# Patient Record
Sex: Male | Born: 1995 | Race: White | Hispanic: No | Marital: Single | Smoking: Never smoker
Health system: Southern US, Community
[De-identification: ages and names within clinical notes are randomized; demographics above are authoritative.]

## PROBLEM LIST (undated history)

## (undated) DIAGNOSIS — J45909 Unspecified asthma, uncomplicated: Secondary | ICD-10-CM

## (undated) HISTORY — PX: TONSILLECTOMY: SUR1361

---
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
## 2017-06-18 IMAGING — CT CT ABD-PELV W/ CM
2 of 4 series · 16 of 46 positions shown, 18 images · IV contrast (APPLIED)
Comparison: None.

CLINICAL DATA: Pt with RLQ abdominal pain, rectal bleeding, pain
started [REDACTED] but has gotten progressively worse per pt.Nausea,
denies vomiting or diarrhea, denies constipation, denies fever

EXAM:
CT ABDOMEN AND PELVIS WITH CONTRAST
TECHNIQUE: Multidetector CT imaging of the abdomen and pelvis was performed
using the standard protocol following bolus administration of
intravenous contrast.
CONTRAST:  100mL [id number] IOPAMIDOL ([id number]) INJECTION 61%

[Series 2: axial st · axial · 0.74mm/px · z∈[-476,-36]mm · 13 of 98 slices shown, 15 images]
[im 5/98  soft-tissue]
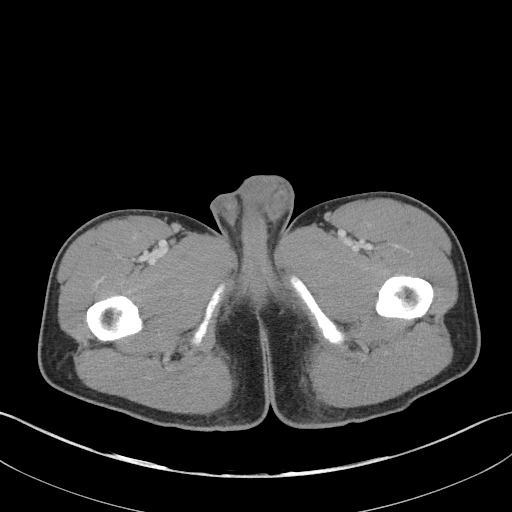
[im 5/98  bone]
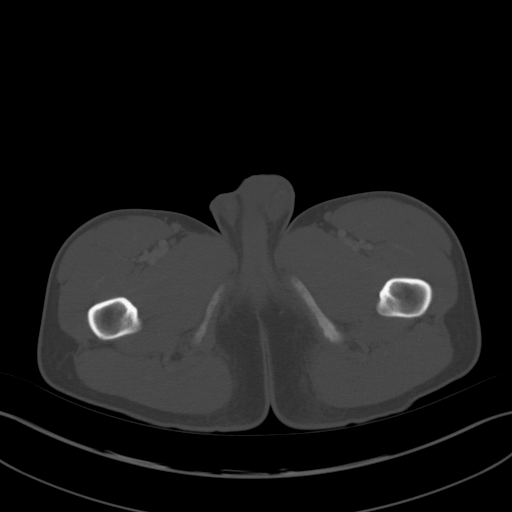
[im 13/98  soft-tissue]
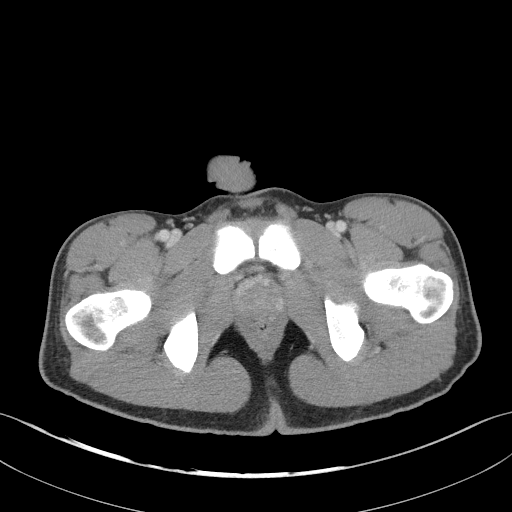
[im 21/98  soft-tissue]
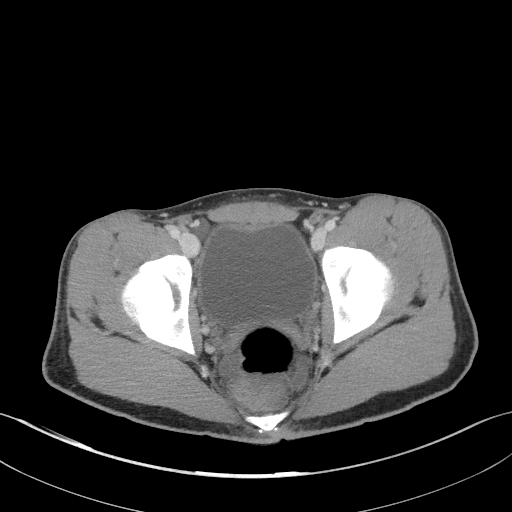
[im 29/98  soft-tissue]
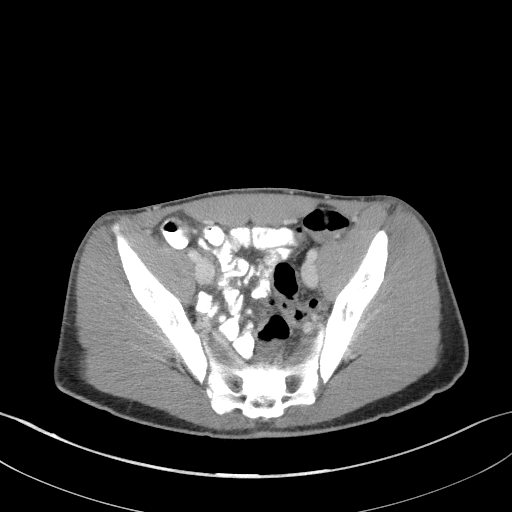
[im 33/98  soft-tissue]
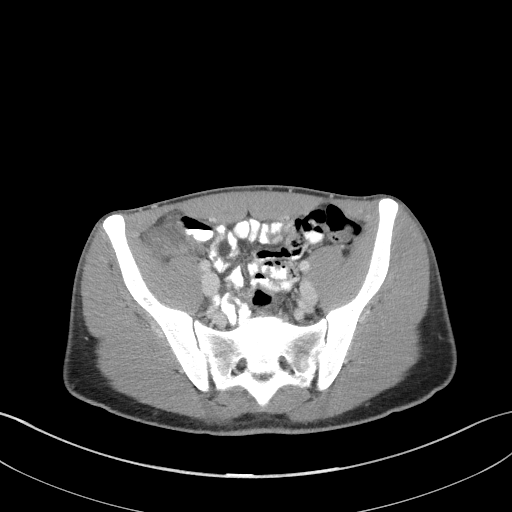
[im 41/98  soft-tissue]
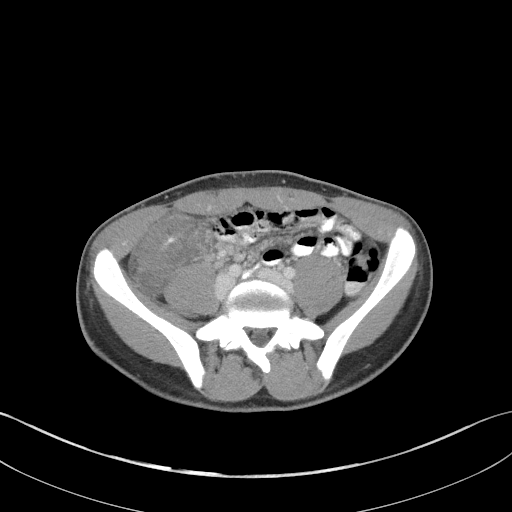
[im 49/98  soft-tissue]
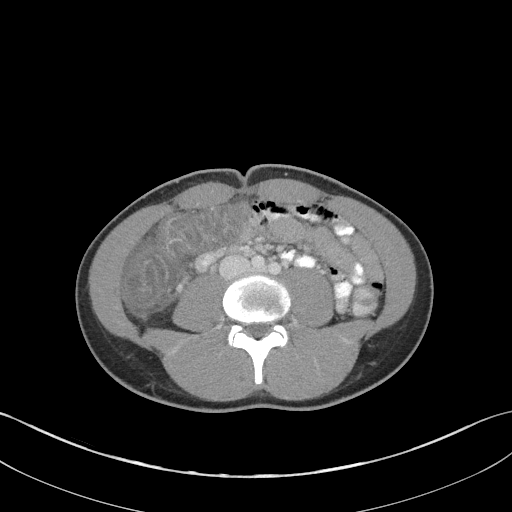
[im 57/98  soft-tissue]
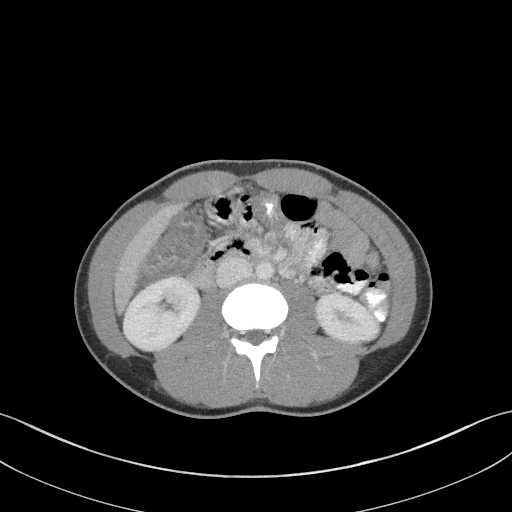
[im 65/98  soft-tissue]
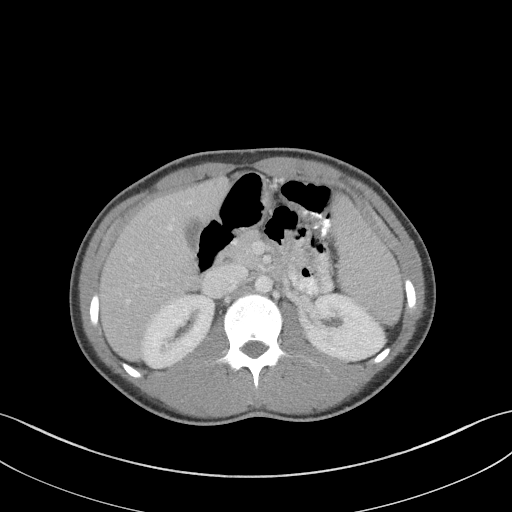
[im 65/98  bone]
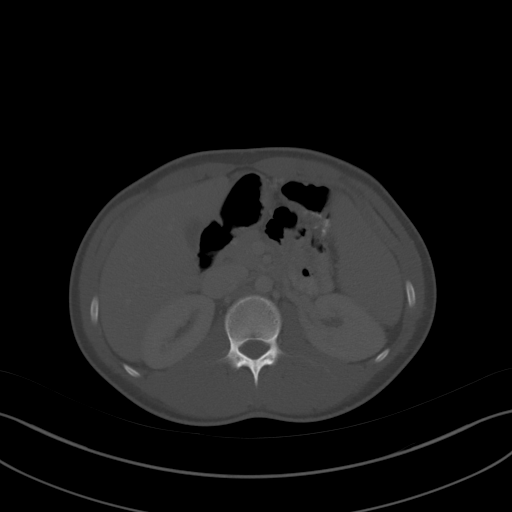
[im 69/98  soft-tissue]
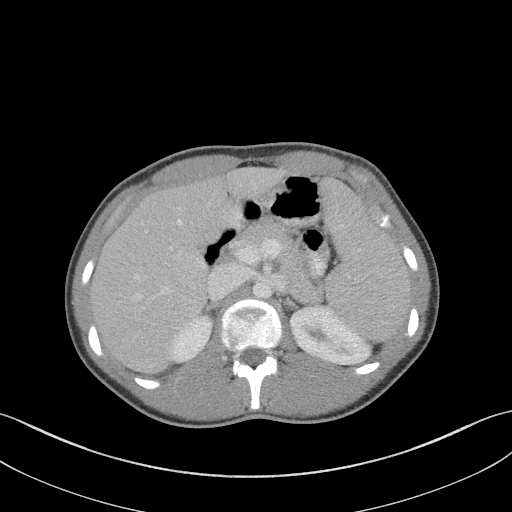
[im 77/98  soft-tissue]
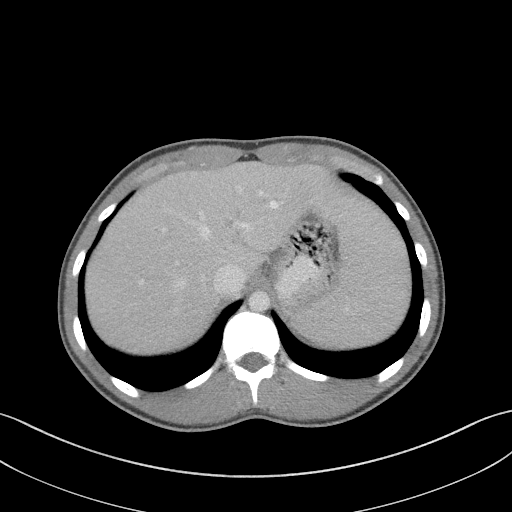
[im 85/98  soft-tissue]
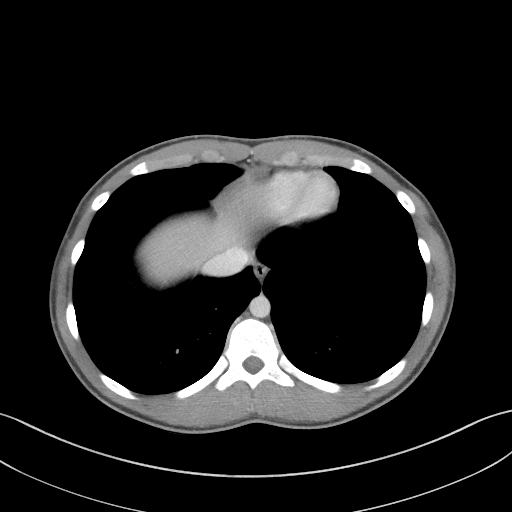
[im 93/98  soft-tissue]
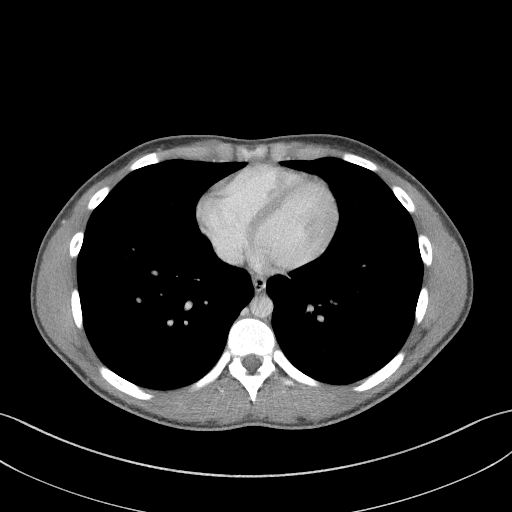

[Series 5: coronal st · coronal · 0.75mm/px · 3 of 65 slices shown]
[im 22/65  soft-tissue]
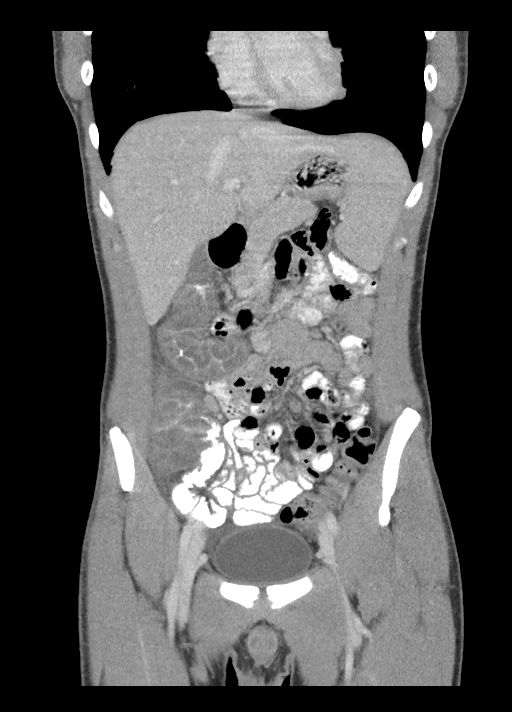
[im 29/65  soft-tissue]
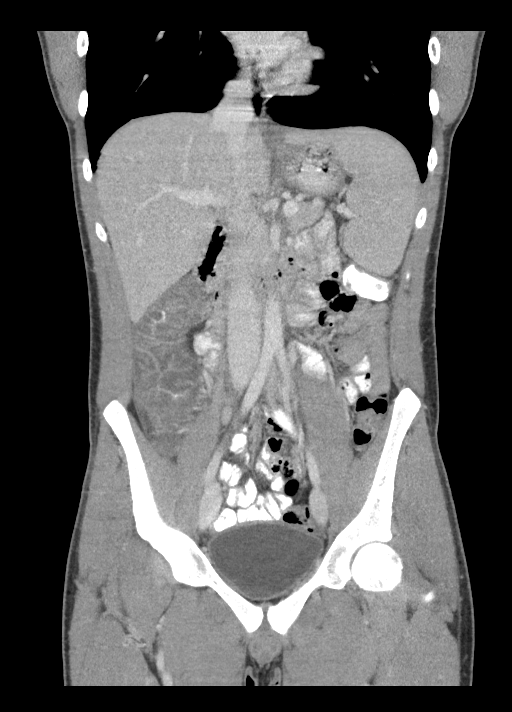
[im 36/65  soft-tissue]
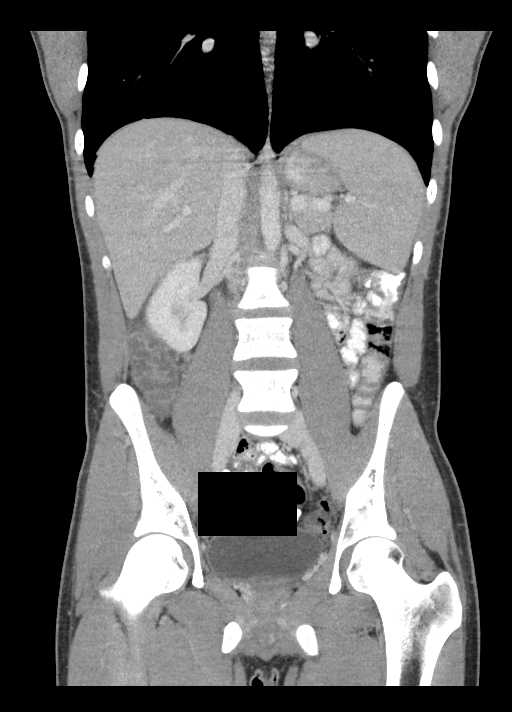

[16 of 46 positions shown; findings below may reference images not displayed]

FINDINGS: Lower chest: No acute abnormality.

Hepatobiliary: No focal liver abnormality is seen. No gallstones,
gallbladder wall thickening, or biliary dilatation.

Pancreas: Unremarkable. No pancreatic ductal dilatation or
surrounding inflammatory changes.

Spleen: Normal in size without focal abnormality.

Adrenals/Urinary Tract: Adrenal glands are unremarkable. Kidneys are
normal, without renal calculi, focal lesion, or hydronephrosis.
Bladder is unremarkable.

Stomach/Bowel: There is significant wall thickening of the cecum,
ascending colon and the right transverse colon. Normal wall
thickness of the left transverse colon and descending and sigmoid
colon. Rectum is unremarkable. Appendix is not visualized. Normal
stomach and small bowel.

Vascular/Lymphatic: No significant vascular findings are present. No
enlarged abdominal or pelvic lymph nodes.

Reproductive: Prostate is unremarkable.

Other: Trace pelvic free fluid.  No abdominal hernia.

Musculoskeletal: Unremarkable
IMPRESSION: 1. Significant wall thickening of the right colon consistent with an
inflammatory or infectious colitis.
2. Appendix not visualize, but no CT findings of acute appendicitis.
3. No other abnormalities.

## 2019-05-28 ENCOUNTER — Ambulatory Visit: Payer: Self-pay | Attending: Internal Medicine

## 2019-05-28 DIAGNOSIS — Z23 Encounter for immunization: Secondary | ICD-10-CM | POA: Insufficient documentation

## 2019-05-28 NOTE — Progress Notes (Signed)
   Covid-19 Vaccination Clinic  Name:  CAISEN MANGAS    MRN: [id number] DOB: Jun 02, 1995  05/28/2019  Mr. Gamero was observed post Covid-19 immunization for 15 minutes without incident. He was provided with Vaccine Information Sheet and instruction to access the V-Safe system.   Mr. Geiman was instructed to call 911 with any severe reactions post vaccine: Marland Kitchen Difficulty breathing  . Swelling of face and throat  . A fast heartbeat  . A bad rash all over body  . Dizziness and weakness   Immunizations Administered    Name Date Dose VIS Date Route   Pfizer COVID-19 Vaccine 05/28/2019 10:58 AM 0.3 mL 03/04/2019 Intramuscular   Manufacturer: [employer], [employer]   Lot: [id number]   NDC: [address]-3

## 2019-06-18 ENCOUNTER — Ambulatory Visit: Payer: Self-pay | Attending: Internal Medicine

## 2019-06-18 DIAGNOSIS — Z23 Encounter for immunization: Secondary | ICD-10-CM

## 2019-06-18 NOTE — Progress Notes (Signed)
   Covid-19 Vaccination Clinic  Name:  David Rasmussen    MRN: [id number] DOB: 07-27-1995  06/18/2019  [name] was observed post Covid-19 immunization for 15 minutes without incident. He was provided with Vaccine Information Sheet and instruction to access the V-Safe system.   [name] was instructed to call 911 with any severe reactions post vaccine: Marland Kitchen Difficulty breathing  . Swelling of face and throat  . A fast heartbeat  . A bad rash all over body  . Dizziness and weakness   Immunizations Administered    Name Date Dose VIS Date Route   Pfizer COVID-19 Vaccine 06/18/2019 12:20 PM 0.3 mL 03/04/2019 Intramuscular   Manufacturer: [employer], [employer]   Lot: [id number]   NDC: [address]-5

## 2019-11-28 ENCOUNTER — Ambulatory Visit (HOSPITAL_COMMUNITY)
Admission: EM | Admit: 2019-11-28 | Discharge: 2019-11-28 | Disposition: A | Discharge status: Home / Self Care | Payer: Managed Care, Other (non HMO) | Attending: Physician Assistant | Admitting: Physician Assistant

## 2019-11-28 ENCOUNTER — Encounter (HOSPITAL_COMMUNITY): Payer: Self-pay | Admitting: Emergency Medicine

## 2019-11-28 ENCOUNTER — Other Ambulatory Visit: Payer: Self-pay

## 2019-11-28 DIAGNOSIS — F419 Anxiety disorder, unspecified: Secondary | ICD-10-CM | POA: Diagnosis not present

## 2019-11-28 MED ORDER — HYDROXYZINE HCL 25 MG PO TABS: 12.5000 mg | ORAL_TABLET | Freq: Three times a day (TID) | ORAL | 0 refills | Status: AC | PRN

## 2019-11-28 NOTE — ED Notes (Signed)
EKG given to Despina Arias, NP and reviewed. Pt placed in Exam room for Eval.

## 2019-11-28 NOTE — ED Triage Notes (Signed)
Pt presents with anxiety, increased heart rate, nausea. States this happened for the first time last night.

## 2019-11-28 NOTE — ED Provider Notes (Signed)
  [facility]   MRN: [id number] DOB: Mar 17, 1996  Subjective:   David Rasmussen is a 24 y.o. male presenting for having an episode of anxiety last night, had an elevated heart rate, nausea and felt stressed out.  Patient states he has not had this happen before.  Denies fever, chest pain, shortness of breath, vomiting.  Patient has a good support network with his friends and at school.  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  States that his instructor is really hard on them to do well for the upcoming competition.  Denies drug use, alcohol use.  Patient is not a smoker.  Has never had to go to therapy.  Denies SI, HI.  No current facility-administered medications for this encounter. No current outpatient medications on file.   No Known Allergies  Past Medical History:  Diagnosis Date  . Asthma      Past Surgical History:  Procedure Laterality Date  . TONSILLECTOMY      History reviewed. No pertinent family history.  Social History   Tobacco Use  . Smoking status: Never Smoker  . Smokeless tobacco: Never Used  Substance Use Topics  . Alcohol use: No  . Drug use: No    ROS   Objective:   Vitals: BP 129/80 (BP Location: Right Arm)   Pulse 86   Temp 98.4 F (36.9 C) (Oral)   Resp 18   SpO2 100%   Physical Exam Constitutional:      General: He is not in acute distress.    Appearance: Normal appearance. He is well-developed and normal weight. He is not ill-appearing, toxic-appearing or diaphoretic.  HENT:     Head: Normocephalic and atraumatic.     Right Ear: External ear normal.     Left Ear: External ear normal.     Nose: Nose normal.     Mouth/Throat:     Pharynx: Oropharynx is clear.  Eyes:     General: No scleral icterus.       Right eye: No discharge.        Left eye: No discharge.     Extraocular Movements: Extraocular movements intact.     Pupils:  Pupils are equal, round, and reactive to light.  Cardiovascular:     Rate and Rhythm: Normal rate.  Pulmonary:     Effort: Pulmonary effort is normal.  Musculoskeletal:     Cervical back: Normal range of motion.  Neurological:     Mental Status: He is alert and oriented to person, place, and time.  Psychiatric:        Mood and Affect: Mood normal.        Behavior: Behavior normal.        Thought Content: Thought content normal.        Judgment: Judgment normal.     Assessment and Plan :   PDMP not reviewed this encounter.  1. Anxiety     Start hydroxyzine, recommended behavioral therapy as well.  No signs of SI, HI.  Patient is otherwise well-appearing, stable for outpatient management with close follow-up with the behavioral therapist and his PCP. Counseled patient on potential for adverse effects with medications prescribed/recommended today, ER and return-to-clinic precautions discussed, patient verbalized understanding.    Wallis Bamberg, PA-C 11/28/19 1550
